# Patient Record
Sex: Male | Born: 1970 | Race: White | Hispanic: No | Smoking: Current every day smoker
Health system: Southern US, Community
[De-identification: ages and names within clinical notes are randomized; demographics above are authoritative.]

---

## 2018-08-12 ENCOUNTER — Emergency Department (HOSPITAL_COMMUNITY): Payer: Self-pay

## 2018-08-12 ENCOUNTER — Encounter (HOSPITAL_COMMUNITY): Payer: Self-pay | Admitting: Obstetrics and Gynecology

## 2018-08-12 ENCOUNTER — Emergency Department (HOSPITAL_COMMUNITY)
Admission: EM | Admit: 2018-08-12 | Discharge: 2018-08-12 | Disposition: A | Discharge status: Home / Self Care | Payer: Self-pay | Attending: Emergency Medicine | Admitting: Emergency Medicine

## 2018-08-12 ENCOUNTER — Other Ambulatory Visit: Payer: Self-pay

## 2018-08-12 DIAGNOSIS — L03213 Periorbital cellulitis: Secondary | ICD-10-CM | POA: Insufficient documentation

## 2018-08-12 DIAGNOSIS — F17210 Nicotine dependence, cigarettes, uncomplicated: Secondary | ICD-10-CM | POA: Insufficient documentation

## 2018-08-12 LAB — BASIC METABOLIC PANEL
ANION GAP: 12 (ref 5–15)
BUN: 10 mg/dL (ref 6–20)
CHLORIDE: 100 mmol/L (ref 98–111)
CO2: 29 mmol/L (ref 22–32)
Calcium: 9.6 mg/dL (ref 8.9–10.3)
Creatinine, Ser: 0.73 mg/dL (ref 0.61–1.24)
Glucose, Bld: 107 mg/dL — ABNORMAL HIGH (ref 70–99)
POTASSIUM: 3.5 mmol/L (ref 3.5–5.1)
SODIUM: 141 mmol/L (ref 135–145)

## 2018-08-12 LAB — CBC WITH DIFFERENTIAL/PLATELET
BASOS ABS: 0 10*3/uL (ref 0.0–0.1)
Basophils Relative: 0 %
Eosinophils Absolute: 0.2 10*3/uL (ref 0.0–0.7)
Eosinophils Relative: 1 %
HCT: 45.3 % (ref 39.0–52.0)
HEMOGLOBIN: 15.3 g/dL (ref 13.0–17.0)
LYMPHS PCT: 19 %
Lymphs Abs: 2.8 10*3/uL (ref 0.7–4.0)
MCH: 30.8 pg (ref 26.0–34.0)
MCHC: 33.8 g/dL (ref 30.0–36.0)
MCV: 91.1 fL (ref 78.0–100.0)
Monocytes Absolute: 1.5 10*3/uL — ABNORMAL HIGH (ref 0.1–1.0)
Monocytes Relative: 11 %
NEUTROS ABS: 9.9 10*3/uL — AB (ref 1.7–7.7)
NEUTROS PCT: 69 %
PLATELETS: 376 10*3/uL (ref 150–400)
RBC: 4.97 MIL/uL (ref 4.22–5.81)
RDW: 14.4 % (ref 11.5–15.5)
WBC: 14.4 10*3/uL — AB (ref 4.0–10.5)

## 2018-08-12 LAB — I-STAT CG4 LACTIC ACID, ED: Lactic Acid, Venous: 0.8 mmol/L (ref 0.5–1.9)

## 2018-08-12 MED ORDER — IOHEXOL 300 MG/ML  SOLN
75.0000 mL | Freq: Once | INTRAMUSCULAR | Status: AC | PRN
  Administered 2018-08-12: 75 mL via INTRAVENOUS

## 2018-08-12 MED ORDER — SULFACETAMIDE SODIUM 10 % OP SOLN: 1.0000 [drp] | OPHTHALMIC | 0 refills | Status: AC

## 2018-08-12 MED ORDER — CLINDAMYCIN PHOSPHATE 600 MG/50ML IV SOLN
600.0000 mg | Freq: Once | INTRAVENOUS | Status: AC
  Administered 2018-08-12: 600 mg via INTRAVENOUS
  Filled 2018-08-12: qty 50

## 2018-08-12 MED ORDER — AMOXICILLIN-POT CLAVULANATE 875-125 MG PO TABS: 1.0000 | ORAL_TABLET | Freq: Two times a day (BID) | ORAL | 0 refills | Status: AC

## 2018-08-12 MED ORDER — MORPHINE SULFATE (PF) 4 MG/ML IV SOLN
6.0000 mg | Freq: Once | INTRAVENOUS | Status: AC
  Administered 2018-08-12: 6 mg via INTRAVENOUS
  Filled 2018-08-12: qty 2
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
## 2018-08-12 NOTE — ED Triage Notes (Signed)
Pt was visiting. Pt reports no itching but he got a rash and now has swelling to the left eye. Pt reports he has no allergies that he is aware of.

## 2018-08-12 NOTE — Procedures (Signed)
Preop diagnosis: Left brow abscess Postop diagnosis: same Procedure: Incision and drainage of left brow abscess Surgeon: Jenne Pane Anesth: Local with 1% lidocaine with 1:100,000 epinephrine Comp: None Findings: Yellow pus came from abscess pocket.  Culture sent. Description: After discussing risks, benefits, and alternatives via the audio interpreter, he was placed in a reclined position and the left brow was injected with local anesthetic.  The brow and periorbital region was prepped with [place].  A stab incision was made within the brow using a 15 blade scalpel.  Subcutaneous tissue was bluntly dissected but the abscess was not initially encountered with the patient feeling pain.  An 18 gauge needle on a 10 cc syringe was used to localize the abscess and pus was sent for culture.  The patient was given IV morphine.  The scalpel was then used to incised the final layer of tissue to enter the abscess and pus drained freely.  Hemostats were used to spread the incision.  A 1/4 inch Penrose drain was placed into the abscess and secured at the skin using a 3-0 Nylon suture.  The patient was cleaned off and a gauze dressing applied.  He tolerated the procedure well and was returned to nursing care in stable condition.

## 2018-08-12 NOTE — ED Provider Notes (Signed)
[facility]-EMERGENCY DEPT Provider Note   CSN: [id number] Arrival date & time: 08/12/18  1414     History   Chief Complaint Chief Complaint  Patient presents with  . Facial Swelling    HPI Bruce Bush is a 47 y.o. male with no significant past medical history, who presents to ED for evaluation of 3-day history of worsening left eye purulent drainage, conjunctival injection that has worsened to eyelid edema.  States that the edema worsened last night and this morning.  Patient has been staying at a hotel and is currently visiting from [place].  He is unsure if he got some "bacteria in the eye."  Patient seen at outpatient clinic today and was sent to the ED to rule out orbital cellulitis.  He denies any pain with EOMs are prior history of similar symptoms.  Denies contact lens or corrective glasses use.  Denies any fevers or recent use of antipyretics, vomiting, changes in vision, headache, trauma to the area.  HPI  History reviewed. No pertinent past medical history.  There are no active problems to display for this patient.   History reviewed. No pertinent surgical history.      Home Medications    Prior to Admission medications   Medication Sig Start Date End Date Taking? Authorizing Provider  acetaminophen (TYLENOL) 500 MG tablet Take 1,000 mg by mouth 2 (two) times daily as needed for mild pain or moderate pain.   Yes [provider]  neomycin-polymyxin-dexamethasone (MAXITROL) 0.1 % ophthalmic suspension Place 1 drop into both eyes every 3 (three) hours.   Yes [provider]  tobramycin (TOBREX) 0.3 % ophthalmic ointment Place 1 application into both eyes every 3 (three) hours.   Yes [provider]  amoxicillin-clavulanate (AUGMENTIN) 875-125 MG tablet Take 1 tablet by mouth every 12 (twelve) hours for 10 days. 08/12/18 08/22/18  Alahni Varone, PA-C  sulfacetamide (BLEPH-10) 10 % ophthalmic solution Place 1-2 drops into the  left eye every 4 (four) hours. 08/12/18   Dietrich Pates, PA-C    Family History No family history on file.  Social History Social History   Tobacco Use  . Smoking status: Current Every Day Smoker    Packs/day: 1.00    Years: 15.00    Pack years: 15.00    Types: Cigarettes  . Smokeless tobacco: Never Used  Substance Use Topics  . Alcohol use: Yes  . Drug use: Not Currently     Allergies   Patient has no known allergies.   Review of Systems Review of Systems  Constitutional: Negative for appetite change, chills and fever.  HENT: Positive for facial swelling. Negative for ear pain, rhinorrhea, sneezing and sore throat.   Eyes: Positive for discharge, redness and itching. Negative for photophobia and visual disturbance.  Respiratory: Negative for cough, chest tightness, shortness of breath and wheezing.   Cardiovascular: Negative for chest pain and palpitations.  Gastrointestinal: Negative for abdominal pain, blood in stool, constipation, diarrhea, nausea and vomiting.  Genitourinary: Negative for dysuria, hematuria and urgency.  Musculoskeletal: Negative for myalgias.  Skin: Negative for rash.  Neurological: Negative for dizziness, weakness and light-headedness.     Physical Exam Updated Vital Signs BP 133/85   Pulse 72   Temp 97.9 F (36.6 C)   Resp 16   SpO2 94%   Physical Exam  Constitutional: He appears well-developed and well-nourished. No distress.  HENT:  Head: Normocephalic and atraumatic.  Nose: Nose normal.  Eyes: Left eye exhibits discharge. No scleral  icterus.  Severe periorbital edema of the L eye.  Purulent drainage noted.  Patient denies any pain with EOMs. Unable to open eye.  Neck: Normal range of motion. Neck supple.  No meningismus.  Cardiovascular: Normal rate, regular rhythm, normal heart sounds and intact distal pulses. Exam reveals no gallop and no friction rub.  No murmur heard. Pulmonary/Chest: Effort normal and breath sounds normal. No  respiratory distress.  Abdominal: Soft. Bowel sounds are normal. He exhibits no distension. There is no tenderness. There is no guarding.  Musculoskeletal: Normal range of motion. He exhibits no edema.  Neurological: He is alert. He exhibits normal muscle tone. Coordination normal.  Skin: Skin is warm and dry. No rash noted.  Psychiatric: He has a normal mood and affect.  Nursing note and vitals reviewed.    ED Treatments / Results  Labs (all labs ordered are listed, but only abnormal results are displayed) Labs Reviewed  BASIC METABOLIC PANEL - Abnormal; Notable for the following components:      Result Value   Glucose, Bld 107 (*)    All other components within normal limits  CBC WITH DIFFERENTIAL/PLATELET - Abnormal; Notable for the following components:   WBC 14.4 (*)    Neutro Abs 9.9 (*)    Monocytes Absolute 1.5 (*)    All other components within normal limits  CULTURE, BLOOD (ROUTINE X 2)  CULTURE, BLOOD (ROUTINE X 2)  AEROBIC/ANAEROBIC CULTURE (SURGICAL/DEEP WOUND)  I-STAT CG4 LACTIC ACID, ED    EKG None  Radiology Ct Orbits W Contrast  Result Date: 08/12/2018 CLINICAL DATA:  LEFT eye swelling with itching and drainage. EXAM: CT ORBITS WITH CONTRAST TECHNIQUE: Multidetector CT images was performed according to the standard protocol following intravenous contrast administration. CONTRAST:  75mL OMNIPAQUE IOHEXOL 300 MG/ML  SOLN COMPARISON:  None. FINDINGS: ORBITS: Mildly enlarged and flattened LEFT superior rectus muscle. Intact ocular globes. Lenses are located. Normal appearance of the optic nerve sheath complexes. Preservation of the orbital fat. Superior ophthalmic veins are not enlarged. VISUALIZED SINUSES: Severe LEFT frontal sinusitis with complete soft tissue opacification and 5 mm focal bony dehiscence outer table contiguous with abscess. Severe LEFT maxillary, LEFT ethmoid sinusitis, potential erosion of ethmoid air cells. Moderate LEFT sphenoid sinusitis. SOFT  TISSUES/BONES: 1.7 x 1.8 x 2 cm irregular LEFT periorbital abscess with postseptal extension into this super orbital fat associated with fat stranding. Status post LEFT anterior maxillary wall and lateral orbital wall ORIF. Old depressed LEFT nasal bone fracture. INTRACRANIAL CONTENTS: Normal. IMPRESSION: 1. Severe LEFT paranasal sinusitis with cortical destruction/osteomyelitis LEFT frontal outer table. Recommend ENT consultation. 2. 1.7 x 1.8 x 2 cm LEFT periorbital abscess contiguous with LEFT frontal sinus, intraorbital extension with potential LEFT superior rectus muscle myositis. 3. If there are meningeal symptoms, recommend MRI of the brain with contrast to assess for intracranial extension. 4. Critical Value/emergent results were called by telephone at the time of interpretation on 08/12/2018 at 6:34 pm to [facility] , who verbally acknowledged these results. Electronically Signed   By: Awilda Metro M.D.   On: 08/12/2018 18:35    Procedures Procedures (including critical care time)  CRITICAL CARE Performed by: Dietrich Pates   Total critical care time: 45 minutes  Critical care time was exclusive of separately billable procedures and treating other patients.  Critical care was necessary to treat or prevent imminent or life-threatening deterioration.  Critical care was time spent personally by me on the following activities: development of treatment plan with patient and/or surrogate  as well as nursing, discussions with consultants, evaluation of patient's response to treatment, examination of patient, obtaining history from patient or surrogate, ordering and performing treatments and interventions, ordering and review of laboratory studies, ordering and review of radiographic studies, pulse oximetry and re-evaluation of patient's condition.  Medications Ordered in ED Medications  iohexol (OMNIPAQUE) 300 MG/ML solution 75 mL (75 mLs Intravenous Contrast Given 08/12/18 1805)  clindamycin  (CLEOCIN) IVPB 600 mg ( Intravenous Stopped 08/12/18 2006)  morphine 4 MG/ML injection 6 mg (6 mg Intravenous Given 08/12/18 2108)     Initial Impression / Assessment and Plan / ED Course  I have reviewed the triage vital signs and the nursing notes.  Pertinent labs & imaging results that were available during my care of the patient were reviewed by me and considered in my medical decision making (see chart for details).     47 year old male presents to ED for evaluation of 3-day history of worsening left eye purulent drainage, punch conjunctival injection and eyelid edema.  States that the edema worsened last night and this morning.  Sent here from outpatient clinic to rule out orbital cellulitis.  On exam there is significant edema and swelling around the left eye.  Patient is unable to open the eye.  He denies any pain with EOMs.  Unable to evaluate conjunctiva or pupils on the left eye.  He is afebrile.  Denies any neck pain, headache or neck stiffness.  Lab work significant for leukocytosis at 14 with left shift.  BMP unremarkable.  CT of the orbits shows sinus headaches extending to possible osteomyelitis and periorbital abscess extending to muscle.  Lactic acid normal.  Blood cultures obtained.  I spoke to Dr. Jenne Pane of ENT who evaluated the patient at the bedside and did I&D with Penrose drain placed.  He requests discharging home with Augmentin twice daily x10 days, eyedrops and follow-up with him in 5 days.  Patient is agreeable to this plan.  At this time there are no meningeal signs that would warrant further imaging for possible extension into the brain.  Patient remains in NAD with normal mental status and no neck rigidity noted.  I did give him strict return precautions to return for any severe or worsening symptoms.  Portions of this note were generated with Scientist, clinical (histocompatibility and immunogenetics). Dictation errors may occur despite best attempts at proofreading.  Final Clinical Impressions(s) / ED  Diagnoses   Final diagnoses:  Periorbital cellulitis of left eye    ED Discharge Orders         Ordered    amoxicillin-clavulanate (AUGMENTIN) 875-125 MG tablet  Every 12 hours     08/12/18 2228    sulfacetamide (BLEPH-10) 10 % ophthalmic solution  Every 4 hours     08/12/18 2228           Dietrich Pates, PA-C 08/12/18 2234    Linwood Dibbles, MD 08/14/18 1919

## 2018-08-12 NOTE — Discharge Instructions (Signed)
Take Augmentin twice a day for 10 days. Use the eyedrops as directed. You will need to follow-up with Dr. Jenne Pane as directed. Return to ED for worsening symptoms, severe headache or neck pain, increase in fevers, numbness in arms or legs.  Prendi Augmentin due volte al giorno per 10 giorni. [place] le gocce come indicato. Dovrai seguire il dott. Jenne Pane come indicato. Ritorna a [place] per peggiorare i sintomi, forte mal di testa o dolore al [address], [address], intorpidimento delle braccia o [place].

## 2018-08-17 LAB — CULTURE, BLOOD (ROUTINE X 2)
CULTURE: NO GROWTH
CULTURE: NO GROWTH
SPECIAL REQUESTS: ADEQUATE
Special Requests: ADEQUATE

## 2018-08-18 LAB — AEROBIC/ANAEROBIC CULTURE W GRAM STAIN (SURGICAL/DEEP WOUND)

## 2018-08-19 ENCOUNTER — Telehealth: Payer: Self-pay | Admitting: *Deleted

## 2018-08-19 NOTE — Telephone Encounter (Signed)
Post ED Visit - Positive Culture Follow-up  Culture report reviewed by antimicrobial stewardship pharmacist:  []  Enzo BiNathan Batchelder, Pharm.D. []  Celedonio MiyamotoJeremy Frens, Pharm.D., BCPS AQ-ID []  Garvin FilaMike Maccia, Pharm.D., BCPS []  Georgina PillionElizabeth Martin, Pharm.D., BCPS []  EvaMinh Pham, [address]Pharm.D., BCPS, AAHIVP []  Estella HuskMichelle Turner, Pharm.D., BCPS, AAHIVP []  Lysle Pearlachel Rumbarger, PharmD, BCPS []  Phillips Climeshuy Dang, PharmD, BCPS []  Agapito GamesAlison Masters, PharmD, BCPS []  Verlan FriendsErin Deja, PharmD Gwynneth AlbrightSara Nimer, PharmD  Positive wound culture Treated with Amoxicillin, organism sensitive to the same and no further patient follow-up is required at this time.  Virl AxeRobertson, Senie [facility]ese [facility] 08/19/2018, 4:19 PM

## 2019-07-09 IMAGING — CT CT ORBITS W/ CM
3 series · 14 of 47 positions shown, 16 images · IV contrast (ISOVUE)
Comparison: None.

CLINICAL DATA: LEFT eye swelling with itching and drainage.

EXAM:
CT ORBITS WITH CONTRAST
TECHNIQUE: Multidetector CT images was performed according to the standard
protocol following intravenous contrast administration.
CONTRAST:  75mL OMNIPAQUE IOHEXOL 300 MG/ML  SOLN

[Series 4: orbits 2.0 h30s st · axial · 0.38mm/px · z∈[+1498,+1590]mm · 8 of 54 slices shown, 10 images]
[im 4/54  brain]
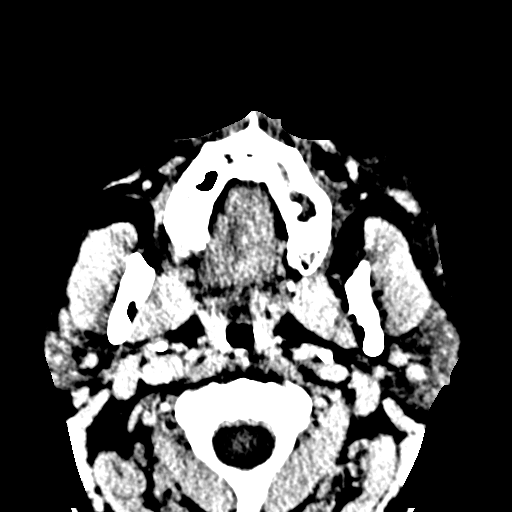
[im 4/54  bone]
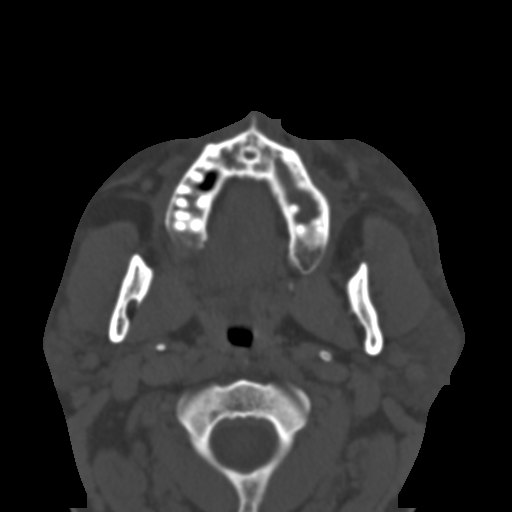
[im 11/54  bone]
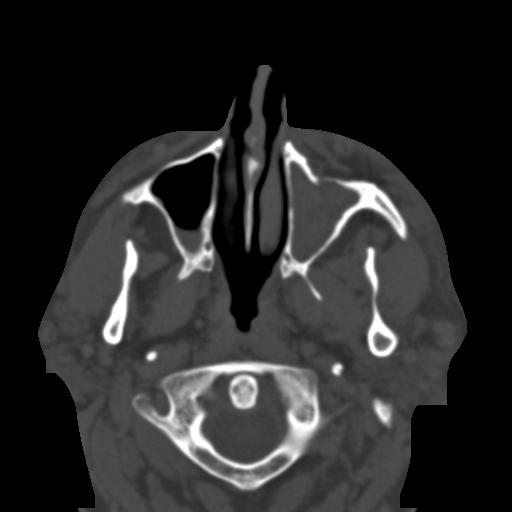
[im 17/54  bone]
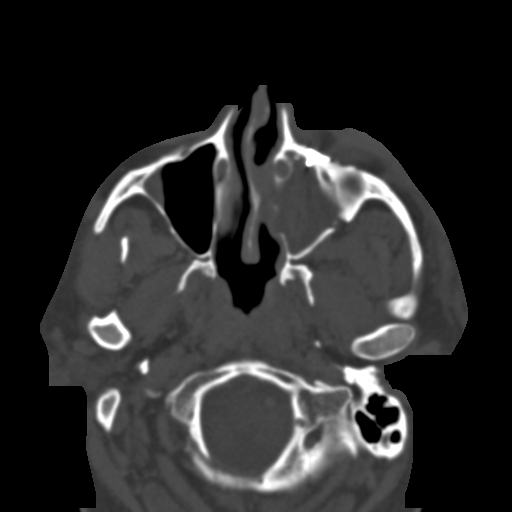
[im 24/54  bone]
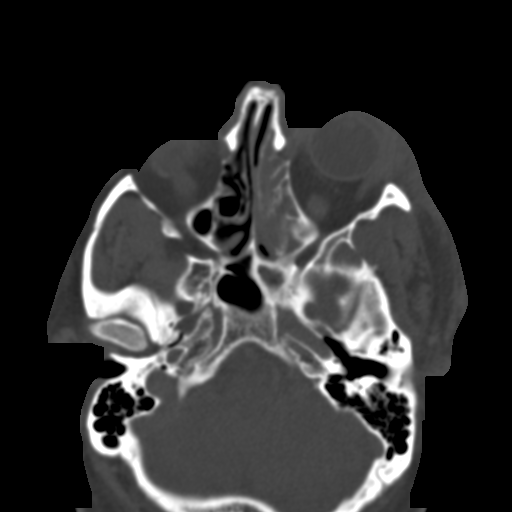
[im 30/54  brain]
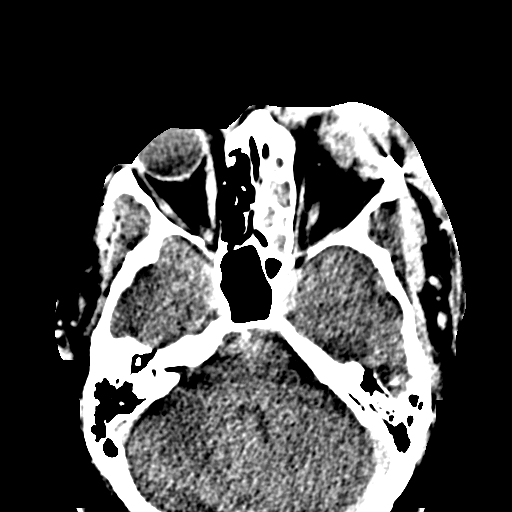
[im 30/54  bone]
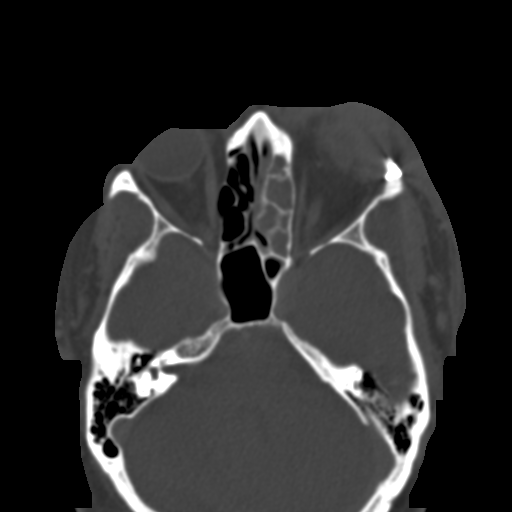
[im 37/54  bone]
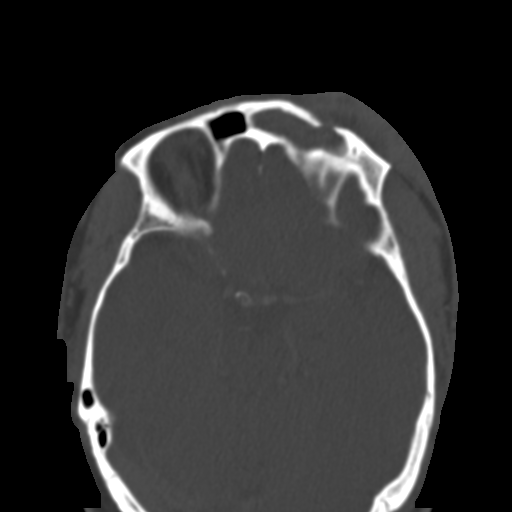
[im 43/54  bone]
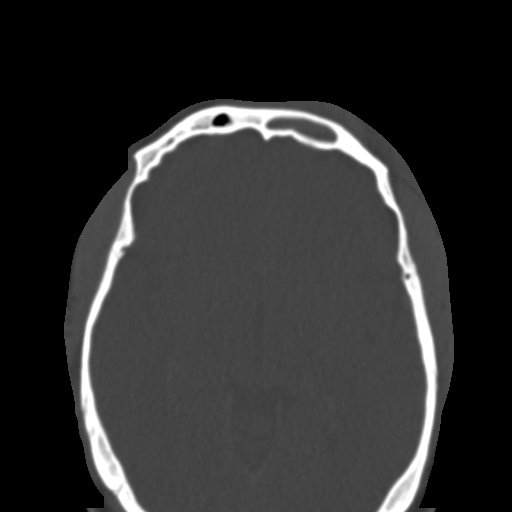
[im 50/54  bone]
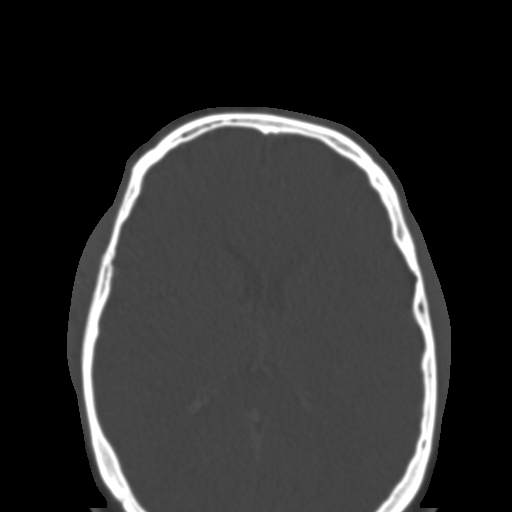

[Series 7: orbits st coronal · coronal · 0.24mm/px · 3 of 70 slices shown]
[im 24/70  bone]
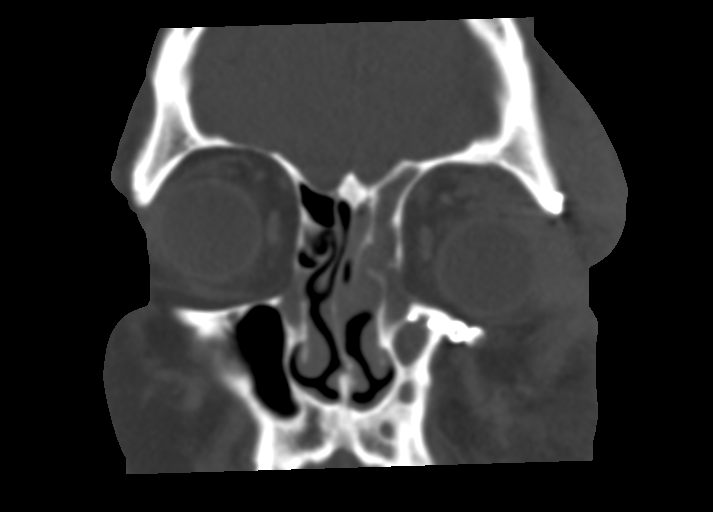
[im 31/70  bone]
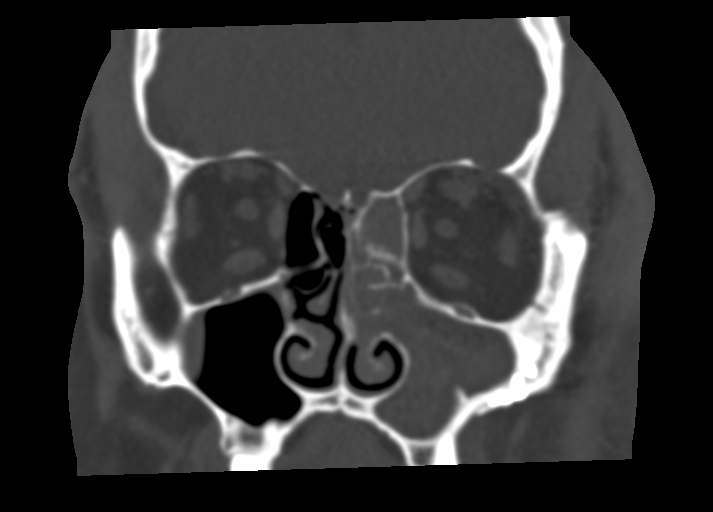
[im 39/70  bone]
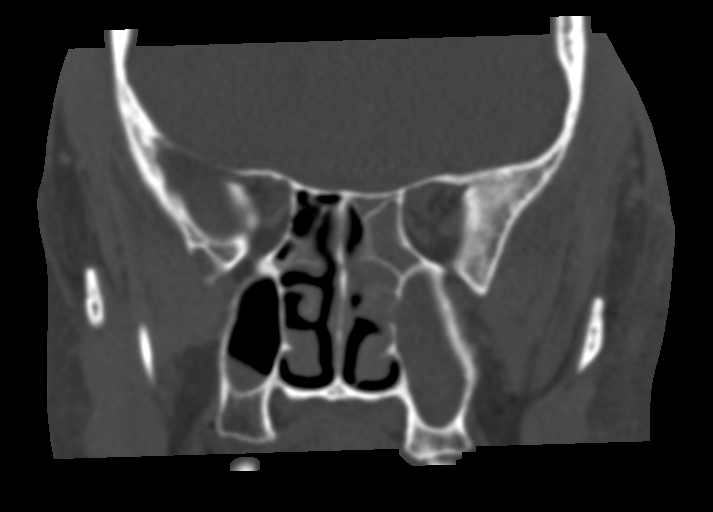

[Series 8: orbits st sagittal · sagittal · 0.25mm/px · 3 of 87 slices shown]
[im 29/87  bone]
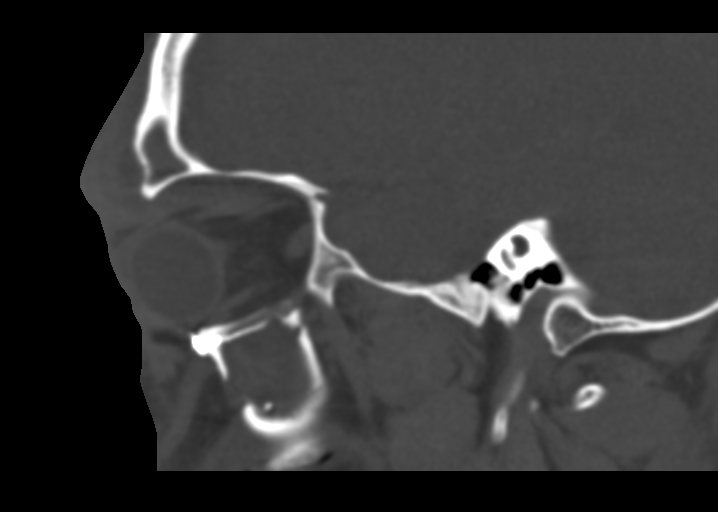
[im 44/87  bone]
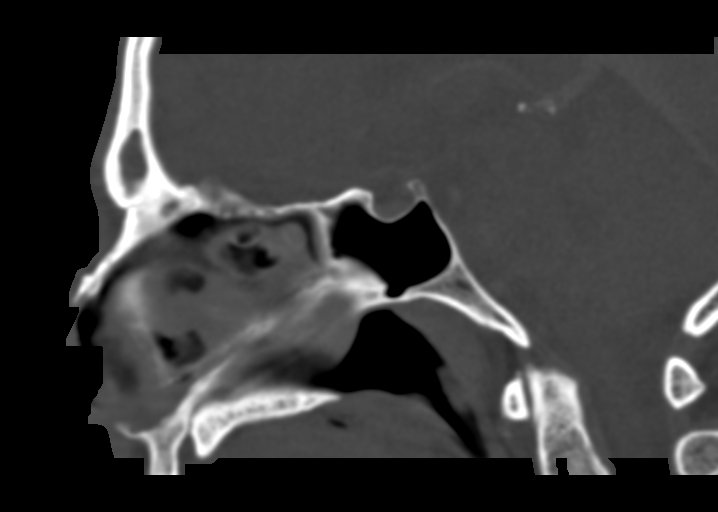
[im 58/87  bone]
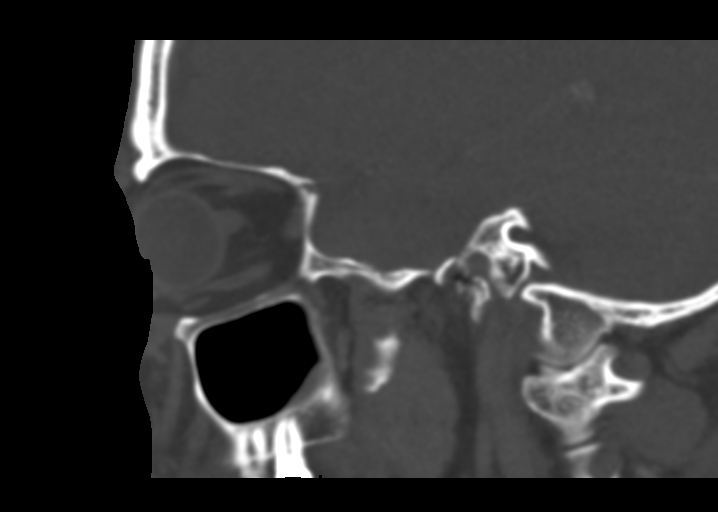

[14 of 47 positions shown; findings below may reference images not displayed]

FINDINGS: ORBITS: Mildly enlarged and flattened LEFT superior rectus muscle.
Intact ocular globes. Lenses are located. Normal appearance of the
optic nerve sheath complexes. Preservation of the orbital fat.
Superior ophthalmic veins are not enlarged.

VISUALIZED SINUSES: Severe LEFT frontal sinusitis with complete soft
tissue opacification and 5 mm focal bony dehiscence outer table
contiguous with abscess. Severe LEFT maxillary, LEFT ethmoid
sinusitis, potential erosion of ethmoid air cells. Moderate LEFT
sphenoid sinusitis.

SOFT TISSUES/BONES: 1.7 x 1.8 x 2 cm irregular LEFT periorbital
abscess with postseptal extension into this super orbital fat
associated with fat stranding. Status post LEFT anterior maxillary
wall and lateral orbital wall ORIF. Old depressed LEFT nasal bone
fracture.

INTRACRANIAL CONTENTS: Normal.
IMPRESSION: 1. Severe LEFT paranasal sinusitis with cortical
destruction/osteomyelitis LEFT frontal outer table. Recommend ENT
consultation.
2. 1.7 x 1.8 x 2 cm LEFT periorbital abscess contiguous with LEFT
frontal sinus, intraorbital extension with potential LEFT superior
rectus muscle myositis.
3. If there are meningeal symptoms, recommend MRI of the brain with
contrast to assess for intracranial extension.
4. Critical Value/emergent results were called by telephone at the
time of interpretation on 08/12/2018 at [DATE] to SHERON CARON , who
verbally acknowledged these results.
